# Patient Record
Sex: Female | Born: 1965 | Race: White | Hispanic: No | Marital: Married | Smoking: Never smoker
Health system: Southern US, Community
[De-identification: ages and names within clinical notes are randomized; demographics above are authoritative.]

## PROBLEM LIST (undated history)

## (undated) DIAGNOSIS — E119 Type 2 diabetes mellitus without complications: Secondary | ICD-10-CM

## (undated) DIAGNOSIS — I639 Cerebral infarction, unspecified: Secondary | ICD-10-CM

## (undated) DIAGNOSIS — N879 Dysplasia of cervix uteri, unspecified: Secondary | ICD-10-CM

## (undated) DIAGNOSIS — S82899A Other fracture of unspecified lower leg, initial encounter for closed fracture: Secondary | ICD-10-CM

## (undated) DIAGNOSIS — Q211 Atrial septal defect: Secondary | ICD-10-CM

## (undated) DIAGNOSIS — Q2112 Patent foramen ovale: Secondary | ICD-10-CM

## (undated) DIAGNOSIS — G35 Multiple sclerosis: Secondary | ICD-10-CM

## (undated) DIAGNOSIS — Z8041 Family history of malignant neoplasm of ovary: Secondary | ICD-10-CM

## (undated) DIAGNOSIS — I10 Essential (primary) hypertension: Secondary | ICD-10-CM

## (undated) DIAGNOSIS — K589 Irritable bowel syndrome without diarrhea: Secondary | ICD-10-CM

## (undated) HISTORY — DX: Dysplasia of cervix uteri, unspecified: N87.9

## (undated) HISTORY — DX: Family history of malignant neoplasm of ovary: Z80.41

## (undated) HISTORY — PX: ANKLE SURGERY: SHX546

## (undated) HISTORY — DX: Other fracture of unspecified lower leg, initial encounter for closed fracture: S82.899A

## (undated) HISTORY — DX: Atrial septal defect: Q21.1

## (undated) HISTORY — DX: Type 2 diabetes mellitus without complications: E11.9

## (undated) HISTORY — DX: Cerebral infarction, unspecified: I63.9

## (undated) HISTORY — DX: Essential (primary) hypertension: I10

## (undated) HISTORY — DX: Irritable bowel syndrome, unspecified: K58.9

## (undated) HISTORY — DX: Patent foramen ovale: Q21.12

## (undated) HISTORY — PX: OTHER SURGICAL HISTORY: SHX169

## (undated) HISTORY — DX: Multiple sclerosis: G35

---

## 1991-05-04 HISTORY — PX: TUBAL LIGATION: SHX77

## 2002-05-03 HISTORY — PX: LAPAROSCOPIC GASTRIC BANDING: SHX1100

## 2005-05-03 HISTORY — PX: ABLATION: SHX5711

## 2008-05-03 HISTORY — PX: CERVICAL CONE BIOPSY: SUR198

## 2018-11-29 ENCOUNTER — Other Ambulatory Visit: Payer: Self-pay | Admitting: Neurology

## 2018-11-29 DIAGNOSIS — G35 Multiple sclerosis: Secondary | ICD-10-CM

## 2018-12-13 ENCOUNTER — Ambulatory Visit: Payer: BC Managed Care – PPO

## 2018-12-23 ENCOUNTER — Other Ambulatory Visit: Payer: Self-pay

## 2018-12-23 ENCOUNTER — Ambulatory Visit
Admission: RE | Admit: 2018-12-23 | Discharge: 2018-12-23 | Disposition: A | Discharge status: Home / Self Care | Payer: BC Managed Care – PPO | Source: Ambulatory Visit | Attending: Neurology | Admitting: Neurology

## 2018-12-23 DIAGNOSIS — G35 Multiple sclerosis: Secondary | ICD-10-CM

## 2018-12-23 LAB — POCT I-STAT CREATININE: Creatinine, Ser: 0.7 mg/dL (ref 0.44–1.00)

## 2018-12-23 MED ORDER — GADOBUTROL 1 MMOL/ML IV SOLN
10.0000 mL | Freq: Once | INTRAVENOUS | Status: AC | PRN
  Administered 2018-12-23: 10:00:00 10 mL via INTRAVENOUS

## 2019-03-01 ENCOUNTER — Other Ambulatory Visit: Payer: Self-pay | Admitting: Medical Oncology

## 2019-03-01 DIAGNOSIS — M79662 Pain in left lower leg: Secondary | ICD-10-CM

## 2019-03-01 DIAGNOSIS — R600 Localized edema: Secondary | ICD-10-CM

## 2019-03-04 DIAGNOSIS — Z1371 Encounter for nonprocreative screening for genetic disease carrier status: Secondary | ICD-10-CM

## 2019-03-04 HISTORY — DX: Encounter for nonprocreative screening for genetic disease carrier status: Z13.71

## 2019-03-14 ENCOUNTER — Ambulatory Visit
Admission: RE | Admit: 2019-03-14 | Discharge: 2019-03-14 | Disposition: A | Discharge status: Home / Self Care | Payer: BC Managed Care – PPO | Source: Ambulatory Visit | Attending: Medical Oncology | Admitting: Medical Oncology

## 2019-03-14 ENCOUNTER — Other Ambulatory Visit: Payer: Self-pay

## 2019-03-14 DIAGNOSIS — R600 Localized edema: Secondary | ICD-10-CM | POA: Diagnosis present

## 2019-03-14 DIAGNOSIS — M79662 Pain in left lower leg: Secondary | ICD-10-CM | POA: Insufficient documentation

## 2019-03-19 NOTE — Progress Notes (Signed)
PCP: Rayetta Humphrey, MD   Chief Complaint  Patient presents with  . Gynecologic Exam  . Hot Flashes    on patch and needs RF    HPI:      Ms. Carol Fisher is a 53 y.o. No obstetric history on file. who LMP was No LMP recorded. Patient has had an ablation., presents today for her NP annual examination.  Her menses are absent due to endometrial ablation/ question postmenopause given age. She does not have AUB.  She does have vasomotor sx, using estradiol 0.1 mg patch with sx control. She is out of Rx and needs RF. Hx of severe vasomotor sx prior to ERT affecting her ability to work/function. Already taking gabapentin 300 mg QHS, can't take during the day due to fatigue. Has never been on progesterone even though still has uterus. 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 Seeing cardio today. Was thought to have DVT LT leg last wk due to leg swelling but eval neg.   Sex activity: single partner. She does not have vaginal dryness.  Last Pap: several yrs ago, normal. Hx of cone bx about 10 yrs ago with neg paps since.  Hx of STDs: HPV  Last mammogram: February 17, 2017  Results were: normal--routine follow-up in 12 months There is a FH of breast cancer in her mom, genetic testing not indicated. There is a  FH of ovarian cancer in her PGM, genetic testing not done. The patient does do self-breast exams.  Colonoscopy: 2019 with polyps; Repeat due after 5 years. Done at [facility].  Tobacco use: The patient denies current or previous tobacco use. Alcohol use: none  No drug use. Exercise: not active  She does not get adequate calcium  in her diet. Taking Rx Vit D supp due to deficiency.  Labs with PCP.  Past Medical History:  Diagnosis Date  . Ankle fracture   . Cervical dysplasia   . CVA (cerebral vascular accident) (HCC)   . Diabetes mellitus, type 2 (HCC)   . Hypertension   . IBS (irritable bowel syndrome)   . Multiple sclerosis (HCC)   . PFO (patent foramen  ovale)      Past Surgical History:  Procedure Laterality Date  . ABLATION  2007  . ANKLE SURGERY    . CERVICAL CONE BIOPSY  2010  . LAPAROSCOPIC GASTRIC BANDING  2004  . OTHER SURGICAL HISTORY     neck fusion, anterior  . TUBAL LIGATION  1993    Family History  Problem Relation Age of Onset  . Breast cancer Mother        late 44s  . Lymphoma Maternal Grandmother   . Ovarian cancer Paternal Grandmother   . Melanoma Paternal Grandfather     Social History   Socioeconomic History  . Marital status: Married    Spouse name: Not on file  . Number of children: Not on file  . Years of education: Not on file  . Highest education level: Not on file  Occupational History  . Not on file  Social Needs  . Financial resource strain: Not on file  . Food insecurity    Worry: Not on file    Inability: Not on file  . Transportation needs    Medical: Not on file    Non-medical: Not on file  Tobacco Use  . Smoking status: Never Smoker  . Smokeless tobacco: Never Used  Substance and Sexual Activity  . Alcohol use: Yes  Comment: socially  . Drug use: Never  . Sexual activity: Yes    Birth control/protection: Surgical    Comment: Ablation  Lifestyle  . Physical activity    Days per week: Not on file    Minutes per session: Not on file  . Stress: Not on file  Relationships  . Social Herbalist on phone: Not on file    Gets together: Not on file    Attends religious service: Not on file    Active member of club or organization: Not on file    Attends meetings of clubs or organizations: Not on file    Relationship status: Not on file  . Intimate partner violence    Fear of current or ex partner: Not on file    Emotionally abused: Not on file    Physically abused: Not on file    Forced sexual activity: Not on file  Other Topics Concern  . Not on file  Social History Narrative  . Not on file     Current Outpatient Medications:  .  aspirin 325 MG tablet,  Take by mouth., Disp: , Rfl:  .  atorvastatin (LIPITOR) 40 MG tablet, Take by mouth., Disp: , Rfl:  .  diclofenac (VOLTAREN) 75 MG EC tablet, Take by mouth., Disp: , Rfl:  .  estradiol (VIVELLE-DOT) 0.1 MG/24HR patch, Place onto the skin., Disp: , Rfl:  .  gabapentin (NEURONTIN) 300 MG capsule, Take 300 mg by mouth at bedtime., Disp: , Rfl:  .  Insulin Glargine (BASAGLAR KWIKPEN) 100 UNIT/ML SOPN, Inject into the skin., Disp: , Rfl:  .  losartan (COZAAR) 100 MG tablet, Take 100 mg by mouth daily., Disp: , Rfl:  .  metFORMIN (GLUCOPHAGE-XR) 500 MG 24 hr tablet, Take 500 mg by mouth 2 (two) times daily., Disp: , Rfl:  .  modafinil (PROVIGIL) 100 MG tablet, Take by mouth., Disp: , Rfl:  .  OCREVUS 300 MG/10ML injection, , Disp: , Rfl:      ROS:  Review of Systems  Constitutional: Negative for fatigue, fever and unexpected weight change.  Respiratory: Negative for cough, shortness of breath and wheezing.   Cardiovascular: Negative for chest pain, palpitations and leg swelling.  Gastrointestinal: Positive for constipation and diarrhea. Negative for blood in stool, nausea and vomiting.  Endocrine: Negative for cold intolerance, heat intolerance and polyuria.  Genitourinary: Negative for dyspareunia, dysuria, flank pain, frequency, genital sores, hematuria, menstrual problem, pelvic pain, urgency, vaginal bleeding, vaginal discharge and vaginal pain.  Musculoskeletal: Positive for arthralgias. Negative for back pain, joint swelling and myalgias.  Skin: Negative for rash.  Neurological: Positive for numbness. Negative for dizziness, syncope, light-headedness and headaches.  Hematological: Negative for adenopathy.  Psychiatric/Behavioral: Negative for agitation, confusion, sleep disturbance and suicidal ideas. The patient is not nervous/anxious.   BREAST: No symptoms    Objective: BP 140/80   Ht 5\' 4"  (1.626 m)   Wt 245 lb (111.1 kg)   BMI 42.05 kg/m    Physical Exam Constitutional:       Appearance: She is well-developed.  Genitourinary:     Vulva, vagina, cervix, uterus, right adnexa and left adnexa normal.     No vulval lesion or tenderness noted.     No vaginal discharge, erythema or tenderness.     No cervical polyp.     Uterus is not enlarged or tender.     No right or left adnexal mass present.     Right adnexa not tender.  Left adnexa not tender.  Neck:     Musculoskeletal: Normal range of motion.     Thyroid: No thyromegaly.  Cardiovascular:     Rate and Rhythm: Normal rate and regular rhythm.     Heart sounds: Normal heart sounds. No murmur.  Pulmonary:     Effort: Pulmonary effort is normal.     Breath sounds: Normal breath sounds.  Chest:     Breasts:        Right: No mass, nipple discharge, skin change or tenderness.        Left: No mass, nipple discharge, skin change or tenderness.  Abdominal:     Palpations: Abdomen is soft.     Tenderness: There is no abdominal tenderness. There is no guarding.  Musculoskeletal: Normal range of motion.  Neurological:     General: No focal deficit present.     Mental Status: She is alert and oriented to person, place, and time.     Cranial Nerves: No cranial nerve deficit.  Skin:    General: Skin is warm and dry.  Psychiatric:        Mood and Affect: Mood normal.        Behavior: Behavior normal.        Thought Content: Thought content normal.        Judgment: Judgment normal.  Vitals signs reviewed.     Assessment/Plan:  Encounter for annual routine gynecological examination  Cervical cancer screening - Plan: [facility] PAP  Screening for HPV (human papillomavirus) - Plan: [facility] PAP  Encounter for screening mammogram for malignant neoplasm of breast - Plan: 3D MAMMOGRAM SCREENING BILATERAL; pt to sched mammo  Family history of breast cancer--MyRisk testing discussed and done today. Will call with results.   Vasomotor symptoms due to menopause--Given CVA hx, pt is not candidate for HRT. Can try cool  packs to neck, fan, increase gabapentin to 300 mg BID (makes too sleepy during day). F/u prn.           GYN counsel breast self exam, mammography screening, menopause, adequate intake of calcium and vitamin D, diet and exercise    F/U  Return in about 1 year (around 03/19/2020).  Alicia B. Copland, PA-C 03/20/2019 12:14 PM

## 2019-03-20 ENCOUNTER — Other Ambulatory Visit (HOSPITAL_COMMUNITY)
Admission: RE | Admit: 2019-03-20 | Discharge: 2019-03-20 | Disposition: A | Discharge status: Home / Self Care | Payer: BC Managed Care – PPO | Source: Ambulatory Visit | Attending: Obstetrics and Gynecology | Admitting: Obstetrics and Gynecology

## 2019-03-20 ENCOUNTER — Encounter: Payer: Self-pay | Admitting: Obstetrics and Gynecology

## 2019-03-20 ENCOUNTER — Other Ambulatory Visit: Payer: Self-pay

## 2019-03-20 ENCOUNTER — Ambulatory Visit (INDEPENDENT_AMBULATORY_CARE_PROVIDER_SITE_OTHER): Payer: BC Managed Care – PPO | Admitting: Obstetrics and Gynecology

## 2019-03-20 VITALS — BP 140/80 | Ht 64.0 in | Wt 245.0 lb

## 2019-03-20 DIAGNOSIS — Z01419 Encounter for gynecological examination (general) (routine) without abnormal findings: Secondary | ICD-10-CM

## 2019-03-20 DIAGNOSIS — N951 Menopausal and female climacteric states: Secondary | ICD-10-CM | POA: Insufficient documentation

## 2019-03-20 DIAGNOSIS — Z1151 Encounter for screening for human papillomavirus (HPV): Secondary | ICD-10-CM

## 2019-03-20 DIAGNOSIS — Z1231 Encounter for screening mammogram for malignant neoplasm of breast: Secondary | ICD-10-CM

## 2019-03-20 DIAGNOSIS — Z124 Encounter for screening for malignant neoplasm of cervix: Secondary | ICD-10-CM | POA: Diagnosis present

## 2019-03-20 DIAGNOSIS — Z803 Family history of malignant neoplasm of breast: Secondary | ICD-10-CM

## 2019-03-20 NOTE — Patient Instructions (Addendum)
I value your feedback and entrusting us with your care. If you get a [facility] patient survey, I would appreciate you taking the time to let us know about your experience today. Thank you!  [facility]: [phone]  [facility]: [phone]  

## 2019-03-23 LAB — CYTOLOGY - PAP
Comment: NEGATIVE
Diagnosis: NEGATIVE
High risk HPV: NEGATIVE

## 2019-04-05 ENCOUNTER — Encounter: Payer: Self-pay | Admitting: Obstetrics and Gynecology

## 2019-04-16 ENCOUNTER — Encounter: Payer: Self-pay | Admitting: Obstetrics and Gynecology

## 2019-04-16 ENCOUNTER — Telehealth: Payer: Self-pay | Admitting: Obstetrics and Gynecology

## 2019-04-16 NOTE — Telephone Encounter (Signed)
Pt aware of neg MyRisk results. IBIS/riskscore=14.8%. No addl screening recommended.   Patient understands these results only apply to her and her children, and this is not indicative of genetic testing results of her other family members. It is recommended that her other family members have genetic testing done.  Pt also understands negative genetic testing doesn't mean she will never get any of these cancers.   Hard copy mailed to pt. F/u prn.

## 2019-04-26 NOTE — Telephone Encounter (Signed)
This encounter was created in error - please disregard.

## 2021-07-03 IMAGING — MR MRI HEAD WITHOUT AND WITH CONTRAST
28 of 36 series · 37 of 48 positions shown · IV contrast (gadavist)
Comparison: Brain MRI report from [facility] 08/15/2017

CLINICAL DATA: Multiple sclerosis follow-up

EXAM:
MRI HEAD WITHOUT AND WITH CONTRAST
TECHNIQUE: Multiplanar, multiecho pulse sequences of the brain and surrounding
structures were obtained without and with intravenous contrast.
CONTRAST:  10 cc Gadavist intravenous

[Series 5: ax dwi_tracew · axial · 3.0mm · 0.60mm/px · 1 of 47 slices shown (1 of 2)]
[im 1/47]
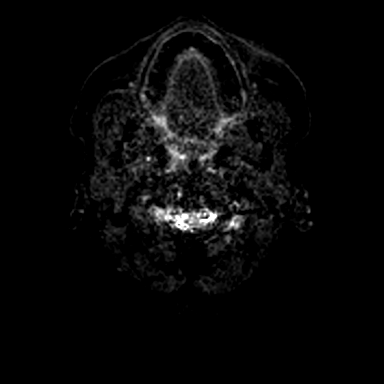

[Series 5: ax dwi_tracew · axial · 3.0mm · 0.60mm/px · 1 of 48 slices shown (2 of 2)]
[im 1/48]
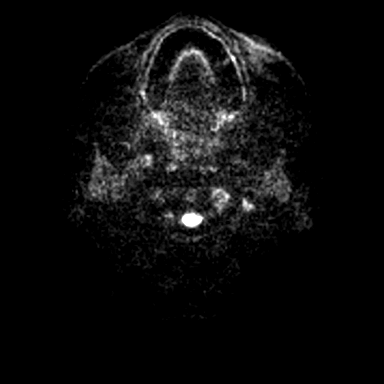

[Series 6: ax dwi_adc · axial · 3.0mm · 0.60mm/px · 1 of 47 slices shown]
[im 1/47]
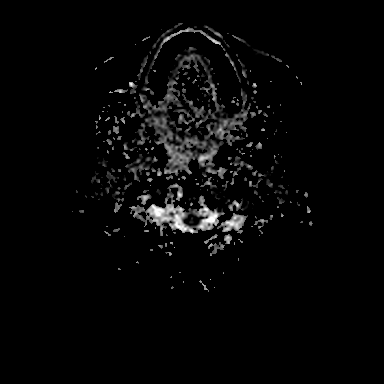

[Series 9: T1 · sagittal · 5.0mm · 0.62mm/px · 1 of 21 slices shown (1 of 6)]
[im 1/21]
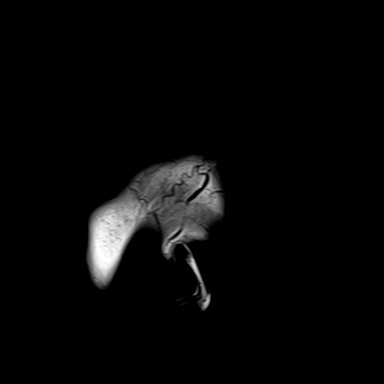

[Series 10: FLAIR · sagittal · 5.0mm · 0.94mm/px · 1 of 21 slices shown (1 of 3)]
[im 1/21]
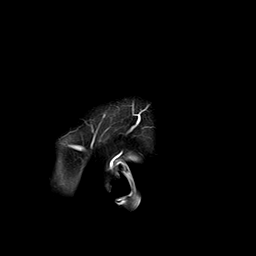

[Series 11: T2 · axial · 5.0mm · 0.53mm/px · 1 of 25 slices shown (1 of 6)]
[im 1/25]
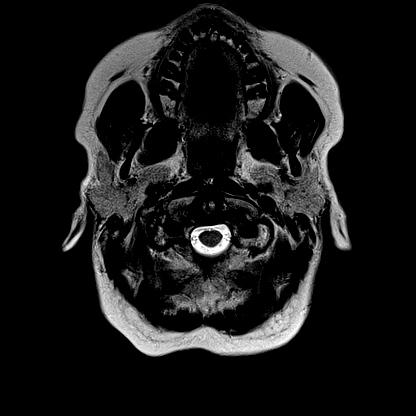

[Series 16: FLAIR · axial · 3.0mm · 0.53mm/px · z∈[-123,+37]mm · 2 of 55 slices shown (2 of 3)]
[im 1/55]
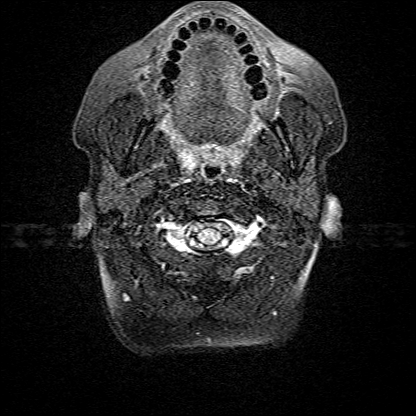
[im 55/55]
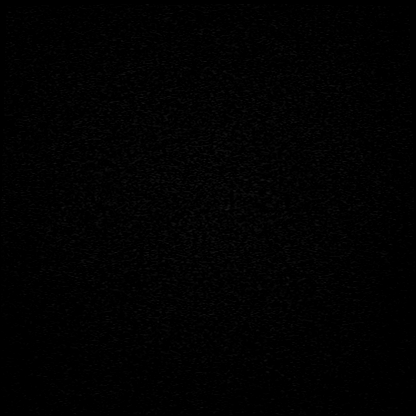

[Series 17: T1 · axial · 1.0mm · 0.98mm/px · z∈[-123,+34]mm · 5 of 160 slices shown (2 of 6)]
[im 1/160]
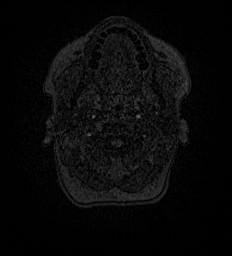
[im 40/160]
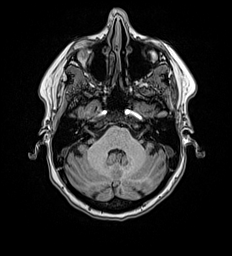
[im 80/160]
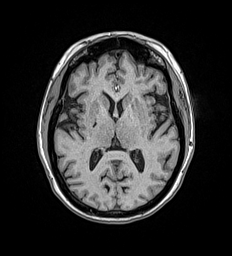
[im 120/160]
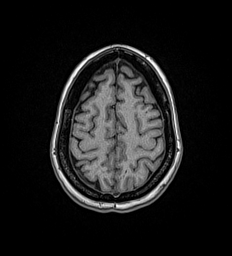
[im 160/160]
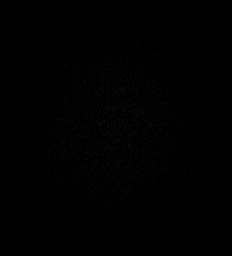

[Series 18: T2 · coronal · 5.0mm · 0.57mm/px · 1 of 29 slices shown (2 of 6)]
[im 1/29]
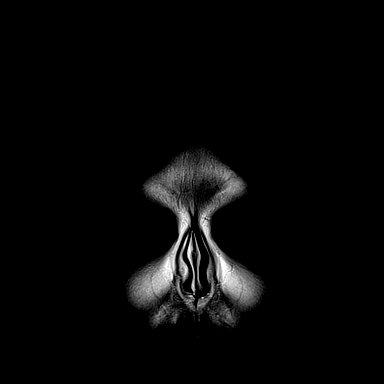

[Series 23: T2 · sagittal · 3.0mm · 0.65mm/px · 1 of 15 slices shown (3 of 6)]
[im 1/15]
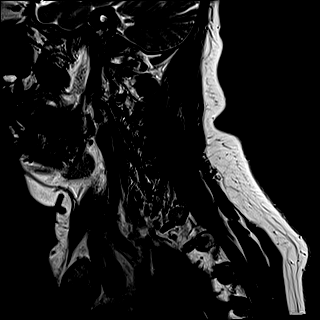

[Series 24: FLAIR · sagittal · 3.0mm · 0.78mm/px · 1 of 15 slices shown (3 of 3)]
[im 1/15]
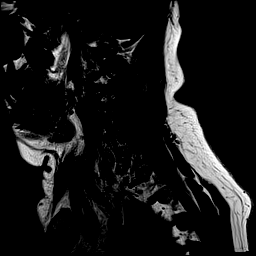

[Series 25: STIR · sagittal · 3.0mm · 0.62mm/px · 1 of 15 slices shown (1 of 2)]
[im 1/15]
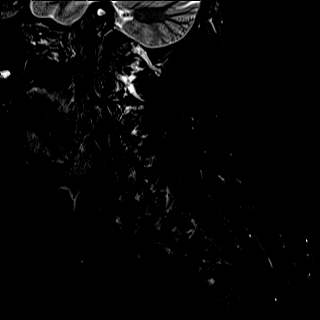

[Series 31: T2 · axial · 3.0mm · 0.70mm/px · 1 of 28 slices shown (4 of 6)]
[im 1/28]
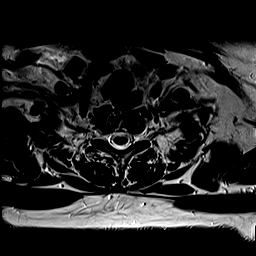

[Series 33: T1 · axial · non-contrast · 3.0mm · 0.35mm/px · 1 of 29 slices shown (3 of 6)]
[im 1/29]
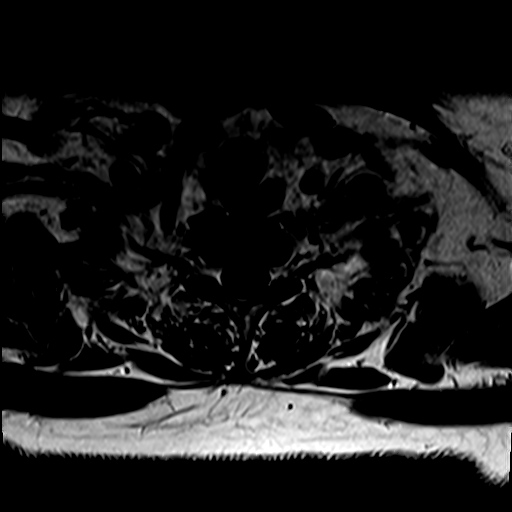

[Series 49: T1 · sagittal · 5.0mm · 1.88mm/px · 1 of 9 slices shown (4 of 6)]
[im 1/9]
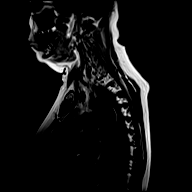

[Series 50: T2 · sagittal · 3.0mm · 1.06mm/px · 1 of 17 slices shown (5 of 6)]
[im 1/17]
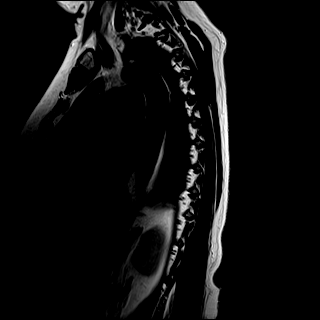

[Series 51: T1 · sagittal · 3.0mm · 1.06mm/px · 1 of 17 slices shown (5 of 6)]
[im 1/17]
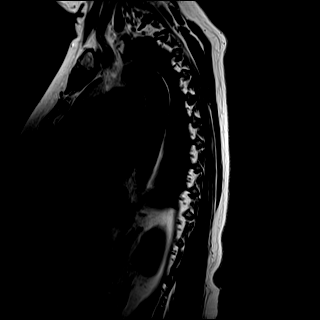

[Series 52: STIR · sagittal · 3.0mm · 0.53mm/px · 1 of 17 slices shown (2 of 2)]
[im 1/17]
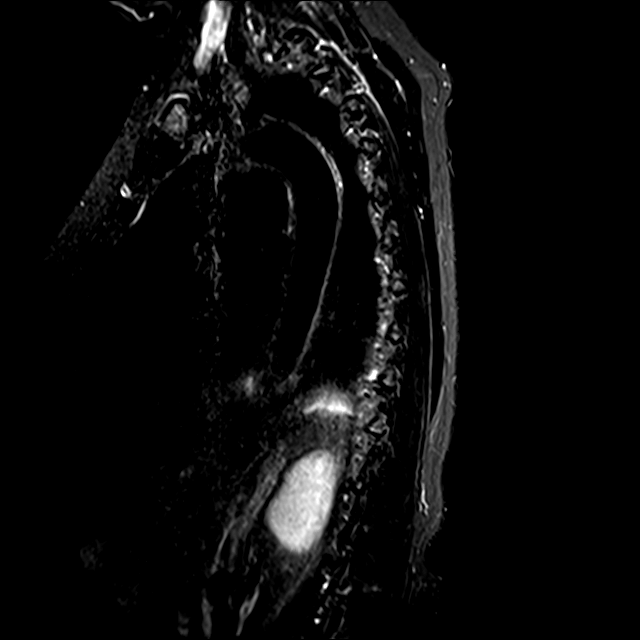

[Series 53: T2 · axial · 4.0mm · 0.59mm/px · 1 of 39 slices shown (6 of 6)]
[im 1/39]
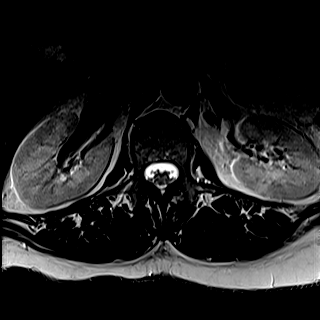

[Series 54: GRE · axial · 4.0mm · 0.37mm/px · 1 of 39 slices shown]
[im 1/39]
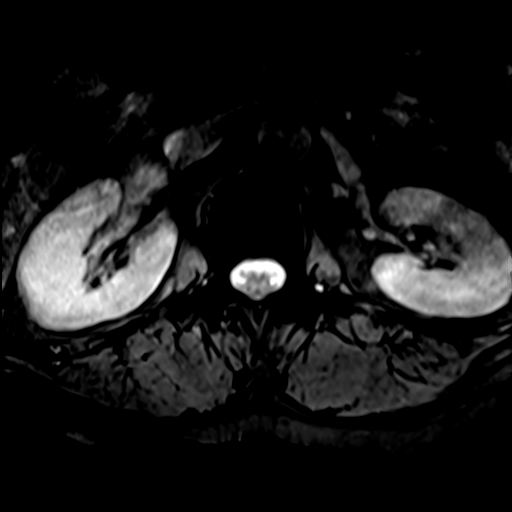

[Series 55: T1 · axial · non-contrast · 4.0mm · 0.37mm/px · 1 of 39 slices shown (6 of 6)]
[im 1/39]
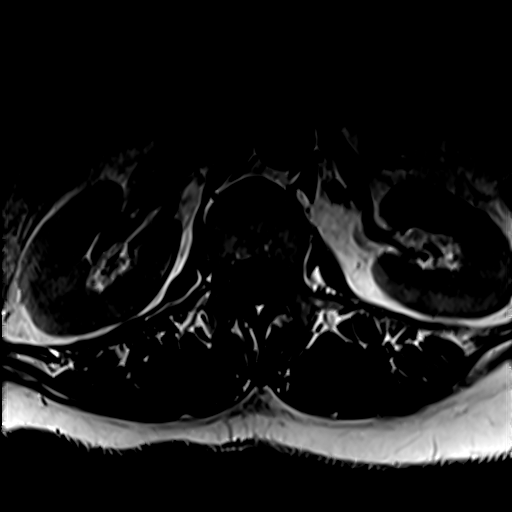

[Series 64: T1 post-contrast · axial · 1.0mm · 0.98mm/px · z∈[-123,+34]mm · 5 of 160 slices shown (1 of 5)]
[im 1/160]
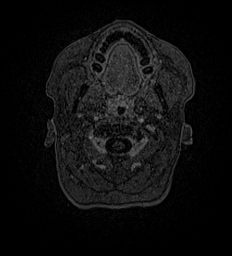
[im 40/160]
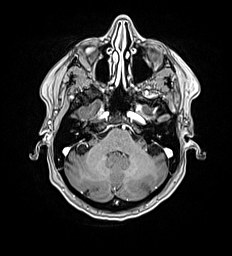
[im 80/160]
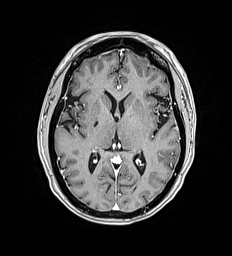
[im 120/160]
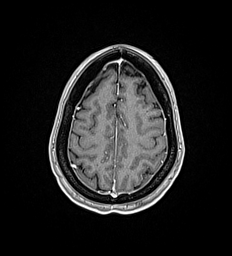
[im 160/160]
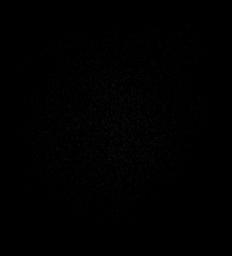

[Series 65: T1 post-contrast · coronal · 5.0mm · 0.57mm/px · 1 of 29 slices shown (2 of 5)]
[im 1/29]
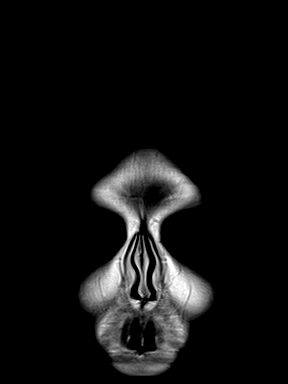

[Series 66: T1 post-contrast · sagittal · 5.0mm · 0.62mm/px · 1 of 21 slices shown (3 of 5)]
[im 1/21]
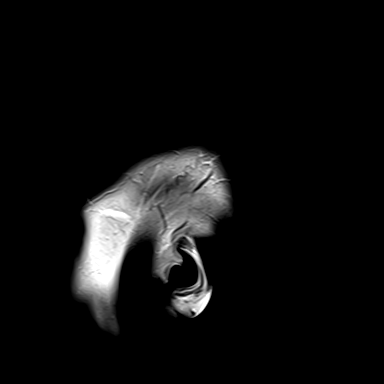

[Series 71: FLAIR fat-sat post-contrast · sagittal · 3.0mm · 0.78mm/px · 1 of 15 slices shown]
[im 1/15]
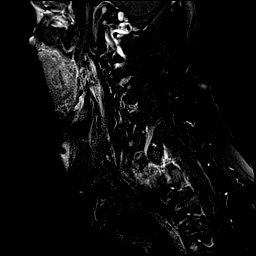

[Series 72: T1 post-contrast · axial · 3.0mm · 0.35mm/px · 1 of 29 slices shown (4 of 5)]
[im 1/29]
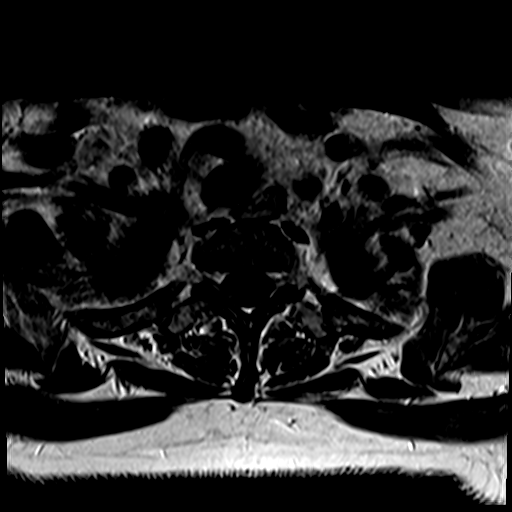

[Series 88: T1 fat-sat post-contrast · sagittal · 3.0mm · 1.06mm/px · 1 of 17 slices shown]
[im 1/17]
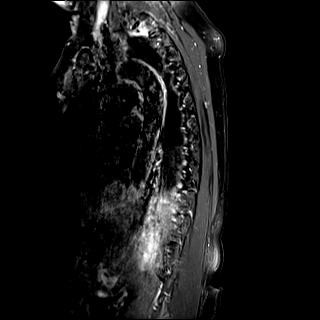

[Series 89: T1 post-contrast · axial · 4.0mm · 0.31mm/px · 1 of 39 slices shown (5 of 5)]
[im 1/39]
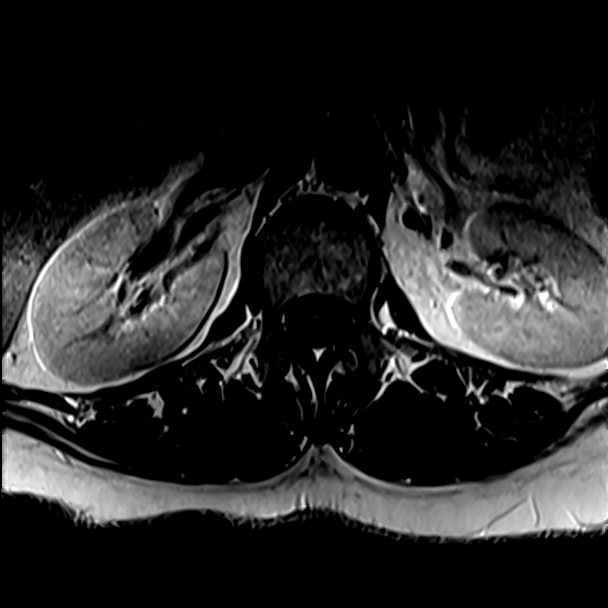

[37 of 48 positions shown; findings below may reference images not displayed]

FINDINGS: Brain: Multiple small FLAIR hyperintensities in the cerebral white
matter without specific pattern, including a periventricular
predilection. No infratentorial involvement. Chronic lacunar infarct
in the right corona radiata/caudate body. Subjacent ovoid T2
hyperintensity is likely dilated perivascular space. No abnormal
enhancement. No infarct, hemorrhage, hydrocephalus, or mass.

Vascular: Major flow voids are preserved

Skull and upper cervical spine: Negative for marrow lesion

Sinuses/Orbits: Negative
IMPRESSION: 1. Nonspecific white matter disease which was also described on an
0872 brain MRI report. There is history of multiple sclerosis with
no abnormal enhancement or diffusion.
2. Remote lacunar infarct on the right.
3.
